# Patient Record
Sex: Female | Born: 1965 | Hispanic: No | Marital: Married | Smoking: Former smoker
Health system: Southern US, Community
[De-identification: ages and names within clinical notes are randomized; demographics above are authoritative.]

## PROBLEM LIST (undated history)

## (undated) DIAGNOSIS — Z9889 Other specified postprocedural states: Secondary | ICD-10-CM

## (undated) DIAGNOSIS — T8859XA Other complications of anesthesia, initial encounter: Secondary | ICD-10-CM

## (undated) DIAGNOSIS — T4145XA Adverse effect of unspecified anesthetic, initial encounter: Secondary | ICD-10-CM

## (undated) DIAGNOSIS — R112 Nausea with vomiting, unspecified: Secondary | ICD-10-CM

## (undated) HISTORY — PX: DILATION AND CURETTAGE OF UTERUS: SHX78

---

## 2001-11-08 ENCOUNTER — Other Ambulatory Visit: Admission: RE | Admit: 2001-11-08 | Discharge: 2001-11-08 | Payer: Self-pay | Admitting: Family Medicine

## 2005-06-20 ENCOUNTER — Ambulatory Visit: Payer: Self-pay | Admitting: Obstetrics and Gynecology

## 2006-02-19 ENCOUNTER — Observation Stay: Payer: Self-pay | Admitting: Obstetrics and Gynecology

## 2006-02-26 ENCOUNTER — Observation Stay: Payer: Self-pay | Admitting: Obstetrics and Gynecology

## 2006-03-05 ENCOUNTER — Observation Stay: Payer: Self-pay | Admitting: Obstetrics and Gynecology

## 2006-03-09 ENCOUNTER — Ambulatory Visit: Payer: Self-pay | Admitting: Obstetrics and Gynecology

## 2006-03-12 ENCOUNTER — Observation Stay: Payer: Self-pay | Admitting: Obstetrics and Gynecology

## 2006-03-19 ENCOUNTER — Inpatient Hospital Stay: Payer: Self-pay | Admitting: Obstetrics and Gynecology

## 2017-12-18 ENCOUNTER — Telehealth: Payer: Self-pay

## 2017-12-18 ENCOUNTER — Other Ambulatory Visit: Payer: Self-pay

## 2017-12-18 DIAGNOSIS — Z1211 Encounter for screening for malignant neoplasm of colon: Secondary | ICD-10-CM

## 2017-12-18 NOTE — Telephone Encounter (Signed)
Contacted patient to see if she was ok with changing her colonoscopy from May 3rd to May 10th (Dr. Bonna Gains on vacation).  Will tentatively place her on for May 10th.

## 2017-12-21 ENCOUNTER — Telehealth: Payer: Self-pay

## 2017-12-21 NOTE — Telephone Encounter (Signed)
Spoke with patient and May 10th did not work out for her so she has been moved to May 2nd.  Notified Trish in Endo this am.  Thanks Sharyn Lull

## 2018-01-04 ENCOUNTER — Telehealth: Payer: Self-pay | Admitting: Gastroenterology

## 2018-01-04 ENCOUNTER — Other Ambulatory Visit: Payer: Self-pay

## 2018-01-04 NOTE — Telephone Encounter (Signed)
Pt left vm to reschedule her Procedure 05/2

## 2018-01-28 ENCOUNTER — Ambulatory Visit
Admission: RE | Admit: 2018-01-28 | Discharge: 2018-01-28 | Disposition: A | Discharge status: Home / Self Care | Payer: Managed Care, Other (non HMO) | Source: Ambulatory Visit | Attending: Gastroenterology | Admitting: Gastroenterology

## 2018-01-28 ENCOUNTER — Encounter: Payer: Self-pay | Admitting: *Deleted

## 2018-01-28 ENCOUNTER — Encounter
Admission: RE | Disposition: A | Discharge status: Home / Self Care | Payer: Self-pay | Source: Ambulatory Visit | Attending: Gastroenterology

## 2018-01-28 ENCOUNTER — Other Ambulatory Visit: Payer: Self-pay

## 2018-01-28 ENCOUNTER — Ambulatory Visit: Payer: Managed Care, Other (non HMO) | Admitting: Registered Nurse

## 2018-01-28 DIAGNOSIS — K635 Polyp of colon: Secondary | ICD-10-CM | POA: Insufficient documentation

## 2018-01-28 DIAGNOSIS — Z87891 Personal history of nicotine dependence: Secondary | ICD-10-CM | POA: Insufficient documentation

## 2018-01-28 DIAGNOSIS — D124 Benign neoplasm of descending colon: Secondary | ICD-10-CM

## 2018-01-28 DIAGNOSIS — Z1211 Encounter for screening for malignant neoplasm of colon: Secondary | ICD-10-CM

## 2018-01-28 DIAGNOSIS — D125 Benign neoplasm of sigmoid colon: Secondary | ICD-10-CM | POA: Diagnosis not present

## 2018-01-28 DIAGNOSIS — D123 Benign neoplasm of transverse colon: Secondary | ICD-10-CM | POA: Diagnosis not present

## 2018-01-28 DIAGNOSIS — Z880 Allergy status to penicillin: Secondary | ICD-10-CM | POA: Insufficient documentation

## 2018-01-28 DIAGNOSIS — K529 Noninfective gastroenteritis and colitis, unspecified: Secondary | ICD-10-CM | POA: Diagnosis not present

## 2018-01-28 DIAGNOSIS — D128 Benign neoplasm of rectum: Secondary | ICD-10-CM | POA: Diagnosis not present

## 2018-01-28 DIAGNOSIS — Z6841 Body Mass Index (BMI) 40.0 and over, adult: Secondary | ICD-10-CM | POA: Diagnosis not present

## 2018-01-28 DIAGNOSIS — D120 Benign neoplasm of cecum: Secondary | ICD-10-CM | POA: Diagnosis not present

## 2018-01-28 DIAGNOSIS — K621 Rectal polyp: Secondary | ICD-10-CM | POA: Diagnosis not present

## 2018-01-28 HISTORY — DX: Other specified postprocedural states: Z98.890

## 2018-01-28 HISTORY — DX: Nausea with vomiting, unspecified: R11.2

## 2018-01-28 HISTORY — DX: Other complications of anesthesia, initial encounter: T88.59XA

## 2018-01-28 HISTORY — DX: Adverse effect of unspecified anesthetic, initial encounter: T41.45XA

## 2018-01-28 HISTORY — PX: COLONOSCOPY WITH PROPOFOL: SHX5780

## 2018-01-28 LAB — POCT PREGNANCY, URINE: Preg Test, Ur: NEGATIVE

## 2018-01-28 SURGERY — COLONOSCOPY WITH PROPOFOL
Anesthesia: General

## 2018-01-28 MED ORDER — SUCCINYLCHOLINE CHLORIDE 20 MG/ML IJ SOLN
INTRAMUSCULAR | Status: AC
  Filled 2018-01-28: qty 1

## 2018-01-28 MED ORDER — SODIUM CHLORIDE 0.9 % IV SOLN
INTRAVENOUS | Status: DC
  Administered 2018-01-28: 09:00:00 via INTRAVENOUS

## 2018-01-28 MED ORDER — LIDOCAINE HCL (CARDIAC) PF 100 MG/5ML IV SOSY
PREFILLED_SYRINGE | INTRAVENOUS | Status: DC | PRN
  Administered 2018-01-28: 60 mg via INTRAVENOUS

## 2018-01-28 MED ORDER — MIDAZOLAM HCL 2 MG/2ML IJ SOLN
INTRAMUSCULAR | Status: DC | PRN
  Administered 2018-01-28: 1 mg via INTRAVENOUS

## 2018-01-28 MED ORDER — PROPOFOL 10 MG/ML IV BOLUS
INTRAVENOUS | Status: DC | PRN
  Administered 2018-01-28: 70 mg via INTRAVENOUS

## 2018-01-28 MED ORDER — PROPOFOL 500 MG/50ML IV EMUL
INTRAVENOUS | Status: AC
  Filled 2018-01-28: qty 150

## 2018-01-28 MED ORDER — LIDOCAINE HCL (PF) 2 % IJ SOLN
INTRAMUSCULAR | Status: AC
  Filled 2018-01-28: qty 10

## 2018-01-28 MED ORDER — MIDAZOLAM HCL 2 MG/2ML IJ SOLN
INTRAMUSCULAR | Status: AC
  Filled 2018-01-28: qty 2

## 2018-01-28 MED ORDER — SODIUM CHLORIDE 0.9 % IJ SOLN
INTRAMUSCULAR | Status: AC
  Filled 2018-01-28: qty 10

## 2018-01-28 MED ORDER — PHENYLEPHRINE HCL 10 MG/ML IJ SOLN
INTRAMUSCULAR | Status: AC
  Filled 2018-01-28: qty 1

## 2018-01-28 MED ORDER — PROPOFOL 500 MG/50ML IV EMUL
INTRAVENOUS | Status: DC | PRN
  Administered 2018-01-28: 120 ug/kg/min via INTRAVENOUS

## 2018-01-28 MED ORDER — EPHEDRINE SULFATE 50 MG/ML IJ SOLN
INTRAMUSCULAR | Status: AC
  Filled 2018-01-28: qty 1

## 2018-01-28 NOTE — Transfer of Care (Signed)
Immediate Anesthesia Transfer of Care Note  Patient: Katelyn Zamora  Procedure(s) Performed: COLONOSCOPY WITH PROPOFOL (N/A )  Patient Location: PACU  Anesthesia Type:General  Level of Consciousness: sedated  Airway & Oxygen Therapy: Patient Spontanous Breathing and Patient connected to nasal cannula oxygen  Post-op Assessment: Report given to RN and Post -op Vital signs reviewed and stable  Post vital signs: Reviewed and stable  Last Vitals:  Vitals Value Taken Time  BP 117/57 01/28/2018 10:12 AM  Temp 36.1 C 01/28/2018 10:12 AM  Pulse 80 01/28/2018 10:12 AM  Resp 17 01/28/2018 10:12 AM  SpO2 99 % 01/28/2018 10:12 AM  Vitals shown include unvalidated device data.  Last Pain:  Vitals:   01/28/18 1011  TempSrc: Tympanic  PainSc: Asleep         Complications: No apparent anesthesia complications

## 2018-01-28 NOTE — Anesthesia Post-op Follow-up Note (Signed)
Anesthesia QCDR form completed.        

## 2018-01-28 NOTE — H&P (Signed)
Cephas Darby, MD [address]  [place]  [place], [place] [address]  Main: [phone]  Fax: [phone] Pager: [phone]  Primary Care Physician:  Elgie Collard, MD Primary Gastroenterologist:  Dr. Cephas Darby  Pre-Procedure History & Physical: HPI:  Katelyn Zamora is a 52 y.o. female is here for an colonoscopy.   Past Medical History:  Diagnosis Date  . Complication of anesthesia   . PONV (postoperative nausea and vomiting)     Past Surgical History:  Procedure Laterality Date  . CESAREAN SECTION    . DILATION AND CURETTAGE OF UTERUS      Prior to Admission medications   Not on File    Allergies as of 12/18/2017  . (Not on File)    History reviewed. No pertinent family history.  Social History   Socioeconomic History  . Marital status: Married    Spouse name: Not on file  . Number of children: Not on file  . Years of education: Not on file  . Highest education level: Not on file  Occupational History  . Not on file  Social Needs  . Financial resource strain: Not on file  . Food insecurity:    Worry: Not on file    Inability: Not on file  . Transportation needs:    Medical: Not on file    Non-medical: Not on file  Tobacco Use  . Smoking status: Former Research scientist (life sciences)  . Smokeless tobacco: Never Used  Substance and Sexual Activity  . Alcohol use: Never    Frequency: Never  . Drug use: Never  . Sexual activity: Not on file  Lifestyle  . Physical activity:    Days per week: Not on file    Minutes per session: Not on file  . Stress: Not on file  Relationships  . Social connections:    Talks on phone: Not on file    Gets together: Not on file    Attends religious service: Not on file    Active member of club or organization: Not on file    Attends meetings of clubs or organizations: Not on file    Relationship status: Not on file  . Intimate partner violence:    Fear of current or ex partner: Not on file    Emotionally abused: Not  on file    Physically abused: Not on file    Forced sexual activity: Not on file  Other Topics Concern  . Not on file  Social History Narrative  . Not on file    Review of Systems: See HPI, otherwise negative ROS  Physical Exam: BP (!) 147/82   Pulse 77   Temp (!) 96.9 F (36.1 C) (Tympanic)   Resp 20   Ht 5\' 5"  (1.651 m)   Wt 280 lb (127 kg)   LMP 03/08/2017   SpO2 98%   BMI 46.59 kg/m  General:   Alert,  pleasant and cooperative in NAD Head:  Normocephalic and atraumatic. Neck:  Supple; no masses or thyromegaly. Lungs:  Clear throughout to auscultation.    Heart:  Regular rate and rhythm. Abdomen:  Soft, nontender and nondistended. Normal bowel sounds, without guarding, and without rebound.   Neurologic:  Alert and  oriented x4;  grossly normal neurologically.  Impression/Plan: Katelyn Zamora is here for an colonoscopy to be performed for colon cancer screening  Risks, benefits, limitations, and alternatives regarding  colonoscopy have been reviewed with the patient.  Questions have been answered.  All parties agreeable.  Sherri Sear, MD  01/28/2018, 9:26 AM

## 2018-01-28 NOTE — Anesthesia Preprocedure Evaluation (Signed)
Anesthesia Evaluation  Patient identified by MRN, date of birth, ID band Patient awake    Reviewed: Allergy & Precautions, H&P , NPO status , reviewed documented beta blocker date and time   History of Anesthesia Complications (+) PONV and history of anesthetic complications  Airway Mallampati: II  TM Distance: >3 FB Neck ROM: full    Dental  (+) Teeth Intact   Pulmonary neg sleep apnea, former smoker,    Pulmonary exam normal        Cardiovascular Normal cardiovascular exam     Neuro/Psych    GI/Hepatic neg GERD  ,  Endo/Other  Morbid obesity  Renal/GU      Musculoskeletal   Abdominal   Peds  Hematology   Anesthesia Other Findings Past Medical History: No date: Complication of anesthesia No date: PONV (postoperative nausea and vomiting)  Past Surgical History: No date: CESAREAN SECTION No date: DILATION AND CURETTAGE OF UTERUS  BMI    Body Mass Index:  46.59 kg/m      Reproductive/Obstetrics                             Anesthesia Physical Anesthesia Plan  ASA: III  Anesthesia Plan: General   Post-op Pain Management:    Induction:   PONV Risk Score and Plan: 3 and Treatment may vary due to age or medical condition and TIVA  Airway Management Planned:   Additional Equipment:   Intra-op Plan:   Post-operative Plan:   Informed Consent: I have reviewed the patients History and Physical, chart, labs and discussed the procedure including the risks, benefits and alternatives for the proposed anesthesia with the patient or authorized representative who has indicated his/her understanding and acceptance.   Dental Advisory Given  Plan Discussed with: CRNA  Anesthesia Plan Comments:         Anesthesia Quick Evaluation

## 2018-01-28 NOTE — Op Note (Addendum)
[facility] Gastroenterology Patient Name: Katelyn Zamora Procedure Date: 01/28/2018 7:48 AM MRN: [id number] Account #: [id number] Date of Birth: 1965-12-21 Admit Type: Outpatient Age: 52 Room: [facility] ENDO ROOM 1 Gender: Female Note Status: Finalized THIS EXAM WAS SENT IN ERROR

## 2018-01-28 NOTE — Op Note (Addendum)
[facility] Gastroenterology Patient Name: Katelyn Zamora Procedure Date: 01/28/2018 9:27 AM MRN: [id number] Account #: [id number] Date of Birth: 1965/12/16 Admit Type: Outpatient Age: 52 Room: [facility] ENDO ROOM 1 Gender: Female Note Status: Finalized Procedure:            Colonoscopy Indications:          Screening for colorectal malignant neoplasm, This is                        the patient's first colonoscopy Providers:            Lin Landsman MD, MD Medicines:            Monitored Anesthesia Care Complications:        No immediate complications. Estimated blood loss: None. Procedure:            Pre-Anesthesia Assessment:                       - Prior to the procedure, a History and Physical was                        performed, and patient medications and allergies were                        reviewed. The patient is competent. The risks and                        benefits of the procedure and the sedation options and                        risks were discussed with the patient. All questions                        were answered and informed consent was obtained.                        Patient identification and proposed procedure were                        verified by the physician, the nurse, the                        anesthesiologist, the anesthetist and the technician in                        the pre-procedure area in the procedure room in the                        endoscopy suite. Mental Status Examination: alert and                        oriented. Airway Examination: normal oropharyngeal                        airway and neck mobility. Respiratory Examination:                        clear to auscultation. CV Examination: normal.  Prophylactic Antibiotics: The patient does not require                        prophylactic antibiotics. Prior Anticoagulants: The                        patient has taken no previous anticoagulant or                         antiplatelet agents. ASA Grade Assessment: III - A                        patient with severe systemic disease. After reviewing                        the risks and benefits, the patient was deemed in                        satisfactory condition to undergo the procedure. The                        anesthesia plan was to use monitored anesthesia care                        (MAC). Immediately prior to administration of                        medications, the patient was re-assessed for adequacy                        to receive sedatives. The heart rate, respiratory rate,                        oxygen saturations, blood pressure, adequacy of                        pulmonary ventilation, and response to care were                        monitored throughout the procedure. The physical status                        of the patient was re-assessed after the procedure.                       After obtaining informed consent, the colonoscope was                        passed under direct vision. Throughout the procedure,                        the patient's blood pressure, pulse, and oxygen                        saturations were monitored continuously. The                        Colonoscope was introduced through the anus and  advanced to the the terminal ileum. The colonoscopy was                        technically difficult and complex due to significant                        looping and the patient's body habitus. Successful                        completion of the procedure was aided by applying                        abdominal pressure. The patient tolerated the procedure                        well. The quality of the bowel preparation was                        evaluated using the BBPS [facility] Bowel Preparation                        Scale) with scores of: Right Colon = 3, Transverse                        Colon = 3 and Left Colon = 3 (entire  mucosa seen well                        with no residual staining, small fragments of stool or                        opaque liquid). The total BBPS score equals 9. Findings:      The perianal and digital rectal examinations were normal. Pertinent       negatives include normal sphincter tone and no palpable rectal lesions.      The terminal ileum appeared normal.      A diminutive polyp was found in the cecum. The polyp was sessile. The       polyp was removed with a cold biopsy forceps. Resection and retrieval       were complete.      A 5 mm polypoid lesion was found at the ileocecal valve. The lesion was       sessile. No bleeding was present. This was biopsied with a cold forceps       for histology.      Three sessile polyps were found in the rectum, descending colon and       transverse colon. The polyps were 5 to 8 mm in size. These polyps were       removed with a hot snare. Resection and retrieval were complete.      A 10 mm polyp was found in the sigmoid colon. The polyp was       pedunculated. The polyp was removed with a hot snare. Resection and       retrieval were complete.      A 5 mm polyp was found in the sigmoid colon. The polyp was sessile. The       polyp was removed with a cold snare. Resection and retrieval were       complete.      The retroflexed view of  the distal rectum and anal verge was normal and       showed no anal or rectal abnormalities. Impression:           - The examined portion of the ileum was normal.                       - One diminutive polyp in the cecum, removed with a                        cold biopsy forceps. Resected and retrieved.                       - Benign polypoid lesion at the ileocecal valve.                        Biopsied.                       - Three 5 to 8 mm polyps in the rectum, in the                        descending colon and in the transverse colon, removed                        with a hot snare. Resected and  retrieved.                       - One 10 mm polyp in the sigmoid colon, removed with a                        hot snare. Resected and retrieved.                       - One 5 mm polyp in the sigmoid colon, removed with a                        cold snare. Resected and retrieved.                       - The distal rectum and anal verge are normal on                        retroflexion view. Recommendation:       - Discharge patient to home.                       - Resume previous diet today.                       - Continue present medications.                       - Await pathology results.                       - Repeat colonoscopy in 3 years for surveillance of                        multiple polyps. Procedure Code(s):    --- Professional ---  45385, Colonoscopy, flexible; with removal of tumor(s),                        polyp(s), or other lesion(s) by snare technique                       45380, 59, Colonoscopy, flexible; with biopsy, single                        or multiple Diagnosis Code(s):    --- Professional ---                       Z12.11, Encounter for screening for malignant neoplasm                        of colon                       D12.0, Benign neoplasm of cecum                       D12.5, Benign neoplasm of sigmoid colon                       K62.1, Rectal polyp                       D12.4, Benign neoplasm of descending colon                       D12.3, Benign neoplasm of transverse colon (hepatic                        flexure or splenic flexure) CPT copyright 2017 American Medical Association. All rights reserved. The codes documented in this report are preliminary and upon coder review may  be revised to meet current compliance requirements. Dr. Ulyess Mort Lin Landsman MD, MD 01/28/2018 10:13:01 AM This report has been signed electronically. Number of Addenda: 0 Note Initiated On: 01/28/2018 9:27 AM Scope Withdrawal Time: 0  hours 29 minutes 25 seconds  Total Procedure Duration: 0 hours 34 minutes 8 seconds       [facility]

## 2018-01-28 NOTE — Anesthesia Postprocedure Evaluation (Signed)
Anesthesia Post Note  Patient: Susy Manor  Procedure(s) Performed: COLONOSCOPY WITH PROPOFOL (N/A )  Patient location during evaluation: Endoscopy Anesthesia Type: General Level of consciousness: awake and alert Pain management: pain level controlled Vital Signs Assessment: post-procedure vital signs reviewed and stable Respiratory status: spontaneous breathing, nonlabored ventilation and respiratory function stable Cardiovascular status: blood pressure returned to baseline and stable Postop Assessment: no apparent nausea or vomiting Anesthetic complications: no     Last Vitals:  Vitals:   01/28/18 1031 01/28/18 1041  BP: 121/74 117/74  Pulse:    Resp:    Temp:    SpO2:      Last Pain:  Vitals:   01/28/18 1041  TempSrc:   PainSc: 0-No pain                 Alphonsus Sias

## 2018-01-29 ENCOUNTER — Encounter: Payer: Self-pay | Admitting: Gastroenterology

## 2018-02-03 ENCOUNTER — Encounter: Payer: Self-pay | Admitting: Gastroenterology

## 2018-02-10 ENCOUNTER — Telehealth: Payer: Self-pay | Admitting: Gastroenterology

## 2018-02-10 NOTE — Telephone Encounter (Signed)
Pt left vm for colonoscopy results °

## 2018-02-16 NOTE — Telephone Encounter (Signed)
Patient has been notified of colonoscopy results and verbalized understanding

## 2018-02-23 LAB — SURGICAL PATHOLOGY

## 2018-06-11 ENCOUNTER — Encounter: Payer: Managed Care, Other (non HMO) | Admitting: Family Medicine

## 2019-01-27 ENCOUNTER — Encounter (HOSPITAL_COMMUNITY): Payer: Self-pay

## 2019-01-27 ENCOUNTER — Emergency Department (HOSPITAL_COMMUNITY): Payer: Self-pay

## 2019-01-27 ENCOUNTER — Other Ambulatory Visit: Payer: Self-pay

## 2019-01-27 ENCOUNTER — Emergency Department (HOSPITAL_COMMUNITY)
Admission: EM | Admit: 2019-01-27 | Discharge: 2019-01-27 | Disposition: A | Discharge status: Home / Self Care | Payer: Self-pay | Attending: Emergency Medicine | Admitting: Emergency Medicine

## 2019-01-27 DIAGNOSIS — R109 Unspecified abdominal pain: Secondary | ICD-10-CM

## 2019-01-27 DIAGNOSIS — Z87891 Personal history of nicotine dependence: Secondary | ICD-10-CM | POA: Insufficient documentation

## 2019-01-27 DIAGNOSIS — R1011 Right upper quadrant pain: Secondary | ICD-10-CM | POA: Insufficient documentation

## 2019-01-27 LAB — CBC WITH DIFFERENTIAL/PLATELET
Abs Immature Granulocytes: 0.04 10*3/uL (ref 0.00–0.07)
Basophils Absolute: 0 10*3/uL (ref 0.0–0.1)
Basophils Relative: 0 %
Eosinophils Absolute: 0 10*3/uL (ref 0.0–0.5)
Eosinophils Relative: 0 %
HCT: 44.8 % (ref 36.0–46.0)
Hemoglobin: 14.2 g/dL (ref 12.0–15.0)
Immature Granulocytes: 0 %
Lymphocytes Relative: 17 %
Lymphs Abs: 1.7 10*3/uL (ref 0.7–4.0)
MCH: 29.3 pg (ref 26.0–34.0)
MCHC: 31.7 g/dL (ref 30.0–36.0)
MCV: 92.6 fL (ref 80.0–100.0)
Monocytes Absolute: 0.6 10*3/uL (ref 0.1–1.0)
Monocytes Relative: 6 %
Neutro Abs: 7.6 10*3/uL (ref 1.7–7.7)
Neutrophils Relative %: 77 %
Platelets: 254 10*3/uL (ref 150–400)
RBC: 4.84 MIL/uL (ref 3.87–5.11)
RDW: 14.3 % (ref 11.5–15.5)
WBC: 10 10*3/uL (ref 4.0–10.5)
nRBC: 0 % (ref 0.0–0.2)

## 2019-01-27 LAB — I-STAT BETA HCG BLOOD, ED (MC, WL, AP ONLY): I-stat hCG, quantitative: <5 m[IU]/mL (ref <5)

## 2019-01-27 LAB — URINALYSIS, ROUTINE W REFLEX MICROSCOPIC
Bilirubin Urine: NEGATIVE
Glucose, UA: NEGATIVE mg/dL
Ketones, ur: NEGATIVE mg/dL
Leukocytes,Ua: NEGATIVE
Nitrite: NEGATIVE
Protein, ur: 100 mg/dL — AB
Specific Gravity, Urine: 1.028 (ref 1.005–1.030)
pH: 5 (ref 5.0–8.0)

## 2019-01-27 LAB — COMPREHENSIVE METABOLIC PANEL
ALT: 20 U/L (ref 0–44)
AST: 18 U/L (ref 15–41)
Albumin: 3.7 g/dL (ref 3.5–5.0)
Alkaline Phosphatase: 44 U/L (ref 38–126)
Anion gap: 12 (ref 5–15)
BUN: 12 mg/dL (ref 6–20)
CO2: 25 mmol/L (ref 22–32)
Calcium: 9.2 mg/dL (ref 8.9–10.3)
Chloride: 99 mmol/L (ref 98–111)
Creatinine, Ser: 0.8 mg/dL (ref 0.44–1.00)
GFR calc Af Amer: >60 mL/min (ref >60)
GFR calc non Af Amer: >60 mL/min (ref >60)
Glucose, Bld: 156 mg/dL — ABNORMAL HIGH (ref 70–99)
Potassium: 4.1 mmol/L (ref 3.5–5.1)
Sodium: 136 mmol/L (ref 135–145)
Total Bilirubin: 0.6 mg/dL (ref 0.3–1.2)
Total Protein: 7.1 g/dL (ref 6.5–8.1)

## 2019-01-27 LAB — LIPASE, BLOOD: Lipase: 21 U/L (ref 11–51)

## 2019-01-27 MED ORDER — ONDANSETRON HCL 4 MG/2ML IJ SOLN
4.0000 mg | Freq: Once | INTRAMUSCULAR | Status: AC
  Administered 2019-01-27: 4 mg via INTRAVENOUS
  Filled 2019-01-27: qty 2

## 2019-01-27 MED ORDER — MORPHINE SULFATE (PF) 4 MG/ML IV SOLN
4.0000 mg | Freq: Once | INTRAVENOUS | Status: AC
  Administered 2019-01-27: 4 mg via INTRAVENOUS
  Filled 2019-01-27: qty 1

## 2019-01-27 MED ORDER — IBUPROFEN 600 MG PO TABS: 600.0000 mg | ORAL_TABLET | Freq: Four times a day (QID) | ORAL | 0 refills | Status: AC | PRN

## 2019-01-27 MED ORDER — HYDROMORPHONE HCL 1 MG/ML IJ SOLN
1.0000 mg | Freq: Once | INTRAMUSCULAR | Status: AC
  Administered 2019-01-27: 1 mg via INTRAVENOUS
  Filled 2019-01-27: qty 1

## 2019-01-27 MED ORDER — HYDROMORPHONE HCL 1 MG/ML IJ SOLN
1.0000 mg | Freq: Once | INTRAMUSCULAR | Status: AC
  Administered 2019-01-27: 06:00:00 1 mg via INTRAVENOUS
  Filled 2019-01-27: qty 1

## 2019-01-27 MED ORDER — ONDANSETRON HCL 4 MG/2ML IJ SOLN
4.0000 mg | Freq: Once | INTRAMUSCULAR | Status: AC
Start: 2019-01-27 — End: 2019-01-27
  Administered 2019-01-27: 4 mg via INTRAVENOUS
  Filled 2019-01-27: qty 2

## 2019-01-27 NOTE — ED Triage Notes (Signed)
Pt c/o right flank pain x 2 hours accompanied by nausea.

## 2019-01-27 NOTE — ED Provider Notes (Signed)
[facility] EMERGENCY DEPARTMENT Provider Note   CSN: [id number] Arrival date & time: 01/27/19  9833    History   Chief Complaint Chief Complaint  Patient presents with  . Flank Pain    HPI Katelyn Zamora is a 53 y.o. female.     Patient presents to the emergency department with a chief complaint of right upper quadrant abdominal pain that radiates to her back and flank.  She states that she is having intermittent symptoms throughout the day yesterday, but the symptoms significantly worsened last night.  She states that she has been trying to avoid coming to the emergency department, but the pain became too great.  She has had associated nausea with vomiting.  Denies any fevers or chills.  Denies any prior abdominal surgeries.  Denies any history of kidney stones.  Denies any association with eating or drinking.  She denies any dysuria or hematuria.  Denies any vaginal complaints.  Rates her pain is severe.  The history is provided by the patient. No language interpreter was used.    Past Medical History:  Diagnosis Date  . Complication of anesthesia   . PONV (postoperative nausea and vomiting)     Patient Active Problem List   Diagnosis Date Noted  . Encounter for screening colonoscopy     Past Surgical History:  Procedure Laterality Date  . CESAREAN SECTION    . COLONOSCOPY WITH PROPOFOL N/A 01/28/2018   Procedure: COLONOSCOPY WITH PROPOFOL;  Surgeon: Lin Landsman, MD;  Location: [facility] ENDOSCOPY;  Service: Endoscopy;  Laterality: N/A;  . DILATION AND CURETTAGE OF UTERUS       OB History   No obstetric history on file.      Home Medications    Prior to Admission medications   Not on File    Family History History reviewed. No pertinent family history.  Social History Social History   Tobacco Use  . Smoking status: Former Research scientist (life sciences)  . Smokeless tobacco: Never Used  Substance Use Topics  . Alcohol use: Never    Frequency: Never  .  Drug use: Never     Allergies   Penicillins   Review of Systems Review of Systems  All other systems reviewed and are negative.    Physical Exam Updated Vital Signs BP (!) 166/80   Pulse 83   Temp 97.7 F (36.5 C) (Oral)   Resp 20   Ht 5\' 5"  (1.651 m)   Wt 136.1 kg   SpO2 100%   BMI 49.92 kg/m   Physical Exam Vitals signs and nursing note reviewed.  Constitutional:      General: She is not in acute distress.    Appearance: She is well-developed.  HENT:     Head: Normocephalic and atraumatic.  Eyes:     Conjunctiva/sclera: Conjunctivae normal.  Neck:     Musculoskeletal: Neck supple.  Cardiovascular:     Rate and Rhythm: Normal rate and regular rhythm.     Heart sounds: No murmur.  Pulmonary:     Effort: Pulmonary effort is normal. No respiratory distress.     Breath sounds: Normal breath sounds.  Abdominal:     Palpations: Abdomen is soft.     Tenderness: There is abdominal tenderness.     Comments: Right upper abdominal tenderness, positive Murphy sign, no other abdominal tenderness  Skin:    General: Skin is warm and dry.  Neurological:     Mental Status: She is alert and oriented to person, place,  and time.  Psychiatric:        Mood and Affect: Mood normal.        Behavior: Behavior normal.        Thought Content: Thought content normal.        Judgment: Judgment normal.      ED Treatments / Results  Labs (all labs ordered are listed, but only abnormal results are displayed) Labs Reviewed  CBC WITH DIFFERENTIAL/PLATELET  COMPREHENSIVE METABOLIC PANEL  LIPASE, BLOOD  URINALYSIS, ROUTINE W REFLEX MICROSCOPIC  I-STAT BETA HCG BLOOD, ED ([facility], [facility], [facility] ONLY)    EKG None  Radiology No results found.  Procedures Procedures (including critical care time)  Medications Ordered in ED Medications  morphine 4 MG/ML injection 4 mg (has no administration in time range)  ondansetron (ZOFRAN) injection 4 mg (has no administration in time range)      Initial Impression / Assessment and Plan / ED Course  I have reviewed the triage vital signs and the nursing notes.  Pertinent labs & imaging results that were available during my care of the patient were reviewed by me and considered in my medical decision making (see chart for details).        Patient with right upper abdominal pain that radiates to the back and right flank.  Intermittent symptoms yesterday, but worsening of symptoms last night.  Will treat pain and nausea.  Will check labs and right upper quadrant ultrasound.  RUQ shows no dilated CBD or stones.  GB wall is normal.  Will proceed with CT to r/o KS.  Patient signed out to oncoming team, who will continue care.  Pain well controlled.  Final Clinical Impressions(s) / ED Diagnoses   Final diagnoses:  None    ED Discharge Orders    None       Montine Circle, PA-C 52/77/82 [id number]    Delora Fuel, MD 36/14/43 2256

## 2019-01-27 NOTE — Discharge Instructions (Signed)
Please call and follow-up closely with your primary care provider for further evaluation of your condition.  CT scan today as well as your ultrasound did not show any acute concerning changes.  Your blood work is normal.  Return to the ER if your condition worsen or if you have any other concern.

## 2019-01-27 NOTE — ED Provider Notes (Signed)
Received signout at the beginning of shift.  Patient here with pain to her right upper quadrant radiates to her right flank.  Work-up includes basic labs, right upper quadrant ultrasound as well as CT scan.  Labs are reassuring, ultrasound CT scan without any acute finding.  Urine shows moderate hemoglobin but no evidence of infection.  Query potential recently passed kidney stone which may contribute to her pain.  Her pregnancy test is negative.  At this time, I encourage patient to follow-up with her primary care provider for further evaluation of her condition.  Return precaution discussed.  BP 134/71   Pulse 76   Temp 97.7 F (36.5 C) (Oral)   Resp 16   Ht 5\' 5"  (1.651 m)   Wt 136.1 kg   SpO2 92%   BMI 49.92 kg/m   Results for orders placed or performed during the hospital encounter of 01/27/19  CBC with Differential/Platelet  Result Value Ref Range   WBC 10.0 4.0 - 10.5 K/uL   RBC 4.84 3.87 - 5.11 MIL/uL   Hemoglobin 14.2 12.0 - 15.0 g/dL   HCT 44.8 36.0 - 46.0 %   MCV 92.6 80.0 - 100.0 fL   MCH 29.3 26.0 - 34.0 pg   MCHC 31.7 30.0 - 36.0 g/dL   RDW 14.3 11.5 - 15.5 %   Platelets 254 150 - 400 K/uL   nRBC 0.0 0.0 - 0.2 %   Neutrophils Relative % 77 %   Neutro Abs 7.6 1.7 - 7.7 K/uL   Lymphocytes Relative 17 %   Lymphs Abs 1.7 0.7 - 4.0 K/uL   Monocytes Relative 6 %   Monocytes Absolute 0.6 0.1 - 1.0 K/uL   Eosinophils Relative 0 %   Eosinophils Absolute 0.0 0.0 - 0.5 K/uL   Basophils Relative 0 %   Basophils Absolute 0.0 0.0 - 0.1 K/uL   Immature Granulocytes 0 %   Abs Immature Granulocytes 0.04 0.00 - 0.07 K/uL  Comprehensive metabolic panel  Result Value Ref Range   Sodium 136 135 - 145 mmol/L   Potassium 4.1 3.5 - 5.1 mmol/L   Chloride 99 98 - 111 mmol/L   CO2 25 22 - 32 mmol/L   Glucose, Bld 156 (H) 70 - 99 mg/dL   BUN 12 6 - 20 mg/dL   Creatinine, Ser 0.80 0.44 - 1.00 mg/dL   Calcium 9.2 8.9 - 10.3 mg/dL   Total Protein 7.1 6.5 - 8.1 g/dL   Albumin 3.7 3.5 -  5.0 g/dL   AST 18 15 - 41 U/L   ALT 20 0 - 44 U/L   Alkaline Phosphatase 44 38 - 126 U/L   Total Bilirubin 0.6 0.3 - 1.2 mg/dL   GFR calc non Af Amer >60 >60 mL/min   GFR calc Af Amer >60 >60 mL/min   Anion gap 12 5 - 15  Lipase, blood  Result Value Ref Range   Lipase 21 11 - 51 U/L  Urinalysis, Routine w reflex microscopic  Result Value Ref Range   Color, Urine YELLOW YELLOW   APPearance HAZY (A) CLEAR   Specific Gravity, Urine 1.028 1.005 - 1.030   pH 5.0 5.0 - 8.0   Glucose, UA NEGATIVE NEGATIVE mg/dL   Hgb urine dipstick MODERATE (A) NEGATIVE   Bilirubin Urine NEGATIVE NEGATIVE   Ketones, ur NEGATIVE NEGATIVE mg/dL   Protein, ur 100 (A) NEGATIVE mg/dL   Nitrite NEGATIVE NEGATIVE   Leukocytes,Ua NEGATIVE NEGATIVE   RBC / HPF 0-5 0 - 5  RBC/hpf   WBC, UA 0-5 0 - 5 WBC/hpf   Bacteria, UA RARE (A) NONE SEEN   Squamous Epithelial / LPF 0-5 0 - 5   Mucus PRESENT   I-Stat Beta hCG blood, ED ([facility], [facility], [facility] only)  Result Value Ref Range   I-stat hCG, quantitative <5.0 <5 mIU/mL   Comment 3           US Abdomen Limited  Result Date: 01/27/2019 CLINICAL DATA:  Right upper quadrant pain EXAM: ULTRASOUND ABDOMEN LIMITED RIGHT UPPER QUADRANT COMPARISON:  None. FINDINGS: Gallbladder: No gallstones or wall thickening visualized. No sonographic Murphy sign noted by sonographer. Common bile duct: Diameter: 5 mm Liver: No focal lesion identified. Echogenic liver when compared to the kidney. Portal vein is patent on color Doppler imaging with normal direction of blood flow towards the liver. IMPRESSION: No acute finding or cholelithiasis. Hepatic steatosis. Electronically Signed   By: Monte Fantasia M.D.   On: 01/27/2019 06:33   Ct Renal Stone Study  Result Date: 01/27/2019 CLINICAL DATA:  Right-sided flank pain for 1 day EXAM: CT ABDOMEN AND PELVIS WITHOUT CONTRAST TECHNIQUE: Multidetector CT imaging of the abdomen and pelvis was performed following the standard protocol without IV contrast.  COMPARISON:  Previous ultrasound FINDINGS: Lower chest: No acute abnormality. Hepatobiliary: No focal liver abnormality is seen. No gallstones, gallbladder wall thickening, or biliary dilatation. Pancreas: Unremarkable. No pancreatic ductal dilatation or surrounding inflammatory changes. Spleen: Normal in size without focal abnormality. Adrenals/Urinary Tract: Adrenal glands are within normal limits bilaterally. Kidneys are well visualized bilaterally. No definitive renal calculi are seen. No obstructive changes are seen. The bladder is decompressed. Stomach/Bowel: Mild diverticular change of the colon is noted without evidence of diverticulitis. The appendix is within normal limits. No small bowel abnormality is seen. The stomach is within normal limits. Vascular/Lymphatic: Aortic atherosclerosis. No enlarged abdominal or pelvic lymph nodes. Reproductive: Uterus and bilateral adnexa are unremarkable. Other: No abdominal wall hernia or abnormality. No abdominopelvic ascites. Musculoskeletal: No acute or significant osseous findings. IMPRESSION: Diverticulosis without diverticulitis. No renal calculi or obstructive changes are noted. Electronically Signed   By: Inez Catalina M.D.   On: 01/27/2019 07:42      Domenic Moras, PA-C 01/27/19 [id number]    Valarie Merino, MD 01/28/19 [phone]

## 2019-04-09 ENCOUNTER — Other Ambulatory Visit: Payer: Self-pay

## 2019-04-09 DIAGNOSIS — Z20822 Contact with and (suspected) exposure to covid-19: Secondary | ICD-10-CM

## 2019-04-10 LAB — NOVEL CORONAVIRUS, NAA: SARS-CoV-2, NAA: NOT DETECTED

## 2019-12-20 ENCOUNTER — Ambulatory Visit: Payer: Medicaid Other | Attending: Physician Assistant

## 2019-12-20 ENCOUNTER — Other Ambulatory Visit: Payer: Self-pay

## 2019-12-20 DIAGNOSIS — G8929 Other chronic pain: Secondary | ICD-10-CM

## 2019-12-20 DIAGNOSIS — M436 Torticollis: Secondary | ICD-10-CM | POA: Insufficient documentation

## 2019-12-20 DIAGNOSIS — M25512 Pain in left shoulder: Secondary | ICD-10-CM | POA: Insufficient documentation

## 2019-12-20 DIAGNOSIS — M5412 Radiculopathy, cervical region: Secondary | ICD-10-CM

## 2019-12-20 DIAGNOSIS — R293 Abnormal posture: Secondary | ICD-10-CM | POA: Insufficient documentation

## 2019-12-20 NOTE — Patient Instructions (Signed)
Stretching levator , scapula and cervical  2-3 reps3-4x/day  5-20 sec hold

## 2019-12-20 NOTE — Therapy (Signed)
[place], [place], [address] Phone: [phone]   Fax:  [phone]  Physical Therapy Evaluation  Patient Details  Name: Katelyn Zamora MRN: [id number] Date of Birth: 1966-07-23 Referring Provider (PT): Tereasa Coop, [place]   Encounter Date: 12/20/2019  PT End of Session - 12/20/19 1057    Visit Number  1    Number of Visits  12    Date for PT Re-Evaluation  02/10/20    Authorization Type  MCD    PT Start Time  [id number]    PT Stop Time  1130    PT Time Calculation (min)  43 min    Activity Tolerance  Patient tolerated treatment well    Behavior During Therapy  [facility] for tasks assessed/performed       Past Medical History:  Diagnosis Date  . Complication of anesthesia   . PONV (postoperative nausea and vomiting)     Past Surgical History:  Procedure Laterality Date  . CESAREAN SECTION    . COLONOSCOPY WITH PROPOFOL N/A 01/28/2018   Procedure: COLONOSCOPY WITH PROPOFOL;  Surgeon: Katelyn Landsman, MD;  Location: [facility] ENDOSCOPY;  Service: Endoscopy;  Laterality: N/A;  . DILATION AND CURETTAGE OF UTERUS      There were no vitals filed for this visit.   Subjective Assessment - 12/20/19 1051    Subjective  She reports neck and LT arm pain. 2 -3 months duration. Pain without injury.    Limitations  Lifting;House hold activities   some times with driving, can't sleep on side   Diagnostic tests  xray :: DDD and spurring.    Patient Stated Goals  Get some relief    Currently in Pain?  Yes    Pain Score  6     Pain Location  Neck    Pain Orientation  Left    Pain Descriptors / Indicators  Spasm    Pain Type  Chronic pain    Pain Radiating Towards  Lt shoulder to elbow.    Pain Onset  More than a month ago    Pain Frequency  Constant    Aggravating Factors   lifting, using LT arm    Pain Relieving Factors  tylenol         [facility] PT Assessment - 12/20/19 0001      Assessment   Medical Diagnosis   Cervical radiculopathy    Referring Provider (PT)  Tereasa Coop, PA-C    Onset Date/Surgical Date  --   2-3 months ago   Hand Dominance  Right    Next MD Visit  3 weeks    Prior Therapy  No      Precautions   Precautions  None      Restrictions   Weight Bearing Restrictions  No      Balance Screen   Has the patient fallen in the past 6 months  Yes    How many times?  1   foot caught on water and slipped   Has the patient had a decrease in activity level because of a fear of falling?   No    Is the patient reluctant to leave their home because of a fear of falling?   No      Prior Function   Level of Independence  Independent      Cognition   Overall Cognitive Status  Within Functional Limits for tasks assessed      ROM / Strength   AROM /  PROM / Strength  AROM;Strength      AROM   AROM Assessment Site  Cervical    Cervical Flexion  48   incr pain   Cervical Extension  38    Cervical - Right Side Bend  22    Cervical - Left Side Bend  22   more discomfort than RT   Cervical - Right Rotation  55    Cervical - Left Rotation  55   more discomfort.      Strength   Overall Strength Comments  UE WNL Pain with abduction and ER       Special Tests    Special Tests  Rotator Cuff Impingement    Rotator Cuff Impingment tests  Neer impingement test;Empty Can test;Full Can test      Neer Impingement test    Findings  Positive    Side  Left      Empty Can test   Findings  Positive    Side  Left      Full Can test   Findings  Positive    Side  Left                Objective measurements completed on examination: See above findings.              PT Education - 12/20/19 1148    Education Details  POC , HEP,    Person(s) Educated  Patient    Methods  Explanation;Demonstration;Tactile cues;Verbal cues;Handout    Comprehension  Verbalized understanding;Returned demonstration       PT Short Term Goals - 12/20/19 1136      PT SHORT TERM GOAL #1    Title  She will be independent with initial HEP    Baseline  no program    Time  2    Period  Weeks    Status  New      PT SHORT TERM GOAL #2   Title  She will report pain decreased 25% in neck    Baseline  6-7/10 at rest at eval    Time  2    Period  Weeks    Status  New        PT Long Term Goals - 12/20/19 1136      PT LONG TERM GOAL #1   Title  She will be indepndendent with all HEP    Baseline  Independent with all HEP issued    Time  6    Period  Weeks    Status  New      PT LONG TERM GOAL #2   Title  She will report pain intermittant  and decreased 75% in neck and LT shoulder    Baseline  6-7/10 pain at rest at eval    Time  6    Period  Weeks    Status  New      PT LONG TERM GOAL #3   Title  She will report pain decreased to 1-2/10 with home tasks uing LT arm    Baseline  Pain in neck and shoulder with home tasks using LT arm    Time  6    Period  Weeks    Status  New             Plan - 12/20/19 1102    Clinical Impression Statement  [name] presents with  chronic neck and shoulder pain that is mostly constant on Lt side. She is independent but has  pain with all activity using LT arm. She demo forward head  and shoulder posture. She is trnder on the LT neck snd [place]. She has some + impingemnet signs.  She should improve with skilled PT and  consistent HEP    Personal Factors and Comorbidities  Fitness;Time since onset of injury/illness/exacerbation    Examination-Activity Limitations  Reach Overhead;Lift    Examination-Participation Restrictions  Cleaning;Laundry    Stability/Clinical Decision Making  Stable/Uncomplicated    Clinical Decision Making  Low    Rehab Potential  Good    PT Frequency  2x / week    PT Duration  6 weeks    PT Treatment/Interventions  Dry needling;Passive range of motion;Patient/family education;Manual techniques;Therapeutic exercise;Therapeutic activities;Electrical Stimulation;Iontophoresis 4mg /ml  Dexamethasone;Ultrasound;Traction;Moist Heat    PT Next Visit Plan  Review HEP , Manual for pain and ROm . Add to HEP if needed. possible tracrtion, modalities    PT Home Exercise Plan  cervical retraction sit and supine , levator stretch, scapul retraction,    Consulted and Agree with Plan of Care  Patient       Patient will benefit from skilled therapeutic intervention in order to improve the following deficits and impairments:  Pain, Postural dysfunction, Increased muscle spasms  Visit Diagnosis: Radiculopathy, cervical region  Chronic left shoulder pain  Stiffness of neck  Abnormal posture     Problem List Patient Active Problem List   Diagnosis Date Noted  . Encounter for screening colonoscopy     Darrel Hoover  PT 12/20/2019, 11:49 AM  [place], [place], [address] Phone: [phone]   Fax:  [phone]  Name: Katelyn Zamora MRN: [id number] Date of Birth: 1965-12-24

## 2020-01-02 ENCOUNTER — Other Ambulatory Visit: Payer: Self-pay

## 2020-01-02 ENCOUNTER — Ambulatory Visit: Payer: Medicaid Other

## 2020-01-02 DIAGNOSIS — R293 Abnormal posture: Secondary | ICD-10-CM

## 2020-01-02 DIAGNOSIS — M5412 Radiculopathy, cervical region: Secondary | ICD-10-CM | POA: Diagnosis not present

## 2020-01-02 DIAGNOSIS — M25512 Pain in left shoulder: Secondary | ICD-10-CM

## 2020-01-02 DIAGNOSIS — G8929 Other chronic pain: Secondary | ICD-10-CM

## 2020-01-02 DIAGNOSIS — M436 Torticollis: Secondary | ICD-10-CM

## 2020-01-02 NOTE — Therapy (Signed)
[place], [place], [address] Phone: [phone]   Fax:  [phone]  Physical Therapy Treatment  Patient Details  Name: Katelyn Zamora MRN: [id number] Date of Birth: Jan 28, 1966 Referring Provider (PT): Tereasa Coop, [place]   Encounter Date: 01/02/2020  PT End of Session - 01/02/20 1140    Visit Number  2    Number of Visits  12    Date for PT Re-Evaluation  02/10/20    Authorization Type  MCD    PT Start Time  1135    PT Stop Time  1215    PT Time Calculation (min)  40 min    Activity Tolerance  Patient tolerated treatment well    Behavior During Therapy  [facility] for tasks assessed/performed       Past Medical History:  Diagnosis Date  . Complication of anesthesia   . PONV (postoperative nausea and vomiting)     Past Surgical History:  Procedure Laterality Date  . CESAREAN SECTION    . COLONOSCOPY WITH PROPOFOL N/A 01/28/2018   Procedure: COLONOSCOPY WITH PROPOFOL;  Surgeon: Lin Landsman, MD;  Location: [facility] ENDOSCOPY;  Service: Endoscopy;  Laterality: N/A;  . DILATION AND CURETTAGE OF UTERUS      There were no vitals filed for this visit.  Subjective Assessment - 01/02/20 1139    Subjective  Some better but still painful. Exercising may be helping    Currently in Pain?  No/denies                       [facility] Adult PT Treatment/Exercise - 01/02/20 0001      Exercises   Exercises  Neck      Neck Exercises: Seated   Neck Retraction  10 reps    Lateral Flexion  Right;10 reps    Lateral Flexion Limitations  and levator to RT . She needed minor cues for all exercises but was accurate after    Other Seated Exercise  yellow ban ER and hor abducvtion  x 12  band issued for HEP      Modalities   Modalities  Traction      Traction   Type of Traction  Cervical    Min (lbs)  0    Max (lbs)  12    Hold Time  60    Rest Time  15    Time  12               PT Short  Term Goals - 12/20/19 1136      PT SHORT TERM GOAL #1   Title  She will be independent with initial HEP    Baseline  no program    Time  2    Period  Weeks    Status  New      PT SHORT TERM GOAL #2   Title  She will report pain decreased 25% in neck    Baseline  6-7/10 at rest at eval    Time  2    Period  Weeks    Status  New        PT Long Term Goals - 12/20/19 1136      PT LONG TERM GOAL #1   Title  She will be indepndendent with all HEP    Baseline  Independent with all HEP issued    Time  6    Period  Weeks    Status  New  PT LONG TERM GOAL #2   Title  She will report pain intermittant  and decreased 75% in neck and LT shoulder    Baseline  6-7/10 pain at rest at eval    Time  6    Period  Weeks    Status  New      PT LONG TERM GOAL #3   Title  She will report pain decreased to 1-2/10 with home tasks uing LT arm    Baseline  Pain in neck and shoulder with home tasks using LT arm    Time  6    Period  Weeks    Status  New            Plan - 01/02/20 1141    Clinical Impression Statement  Neck and shoulder pain improved and doing exercises correctly Added band for strength and  move to rd if no problems . increase traction to 15 # if no problems.    PT Treatment/Interventions  Dry needling;Passive range of motion;Patient/family education;Manual techniques;Therapeutic exercise;Therapeutic activities;Electrical Stimulation;Iontophoresis 4mg /ml Dexamethasone;Ultrasound;Traction;Moist Heat    PT Next Visit Plan  Review HEP , Manual for pain and ROm . Add to HEP if needed. tracrtion, modalities    PT Home Exercise Plan  cervical retraction sit and supine , levator stretch, scapul retraction,  Shoulder ER with band  Hor abduct with band    Consulted and Agree with Plan of Care  Patient       Patient will benefit from skilled therapeutic intervention in order to improve the following deficits and impairments:  Pain, Postural dysfunction, Increased muscle  spasms  Visit Diagnosis: Radiculopathy, cervical region  Chronic left shoulder pain  Stiffness of neck  Abnormal posture     Problem List Patient Active Problem List   Diagnosis Date Noted  . Encounter for screening colonoscopy     Darrel Hoover  PT 01/02/2020, 12:07 PM  [place], [place], [address] Phone: [phone]   Fax:  [phone]  Name: Katelyn Zamora MRN: [id number] Date of Birth: [birth year]

## 2020-01-09 ENCOUNTER — Other Ambulatory Visit: Payer: Self-pay

## 2020-01-09 ENCOUNTER — Ambulatory Visit: Payer: Medicaid Other | Attending: Physician Assistant

## 2020-01-09 DIAGNOSIS — M436 Torticollis: Secondary | ICD-10-CM | POA: Insufficient documentation

## 2020-01-09 DIAGNOSIS — R293 Abnormal posture: Secondary | ICD-10-CM | POA: Diagnosis present

## 2020-01-09 DIAGNOSIS — G8929 Other chronic pain: Secondary | ICD-10-CM | POA: Insufficient documentation

## 2020-01-09 DIAGNOSIS — M5412 Radiculopathy, cervical region: Secondary | ICD-10-CM | POA: Diagnosis present

## 2020-01-09 DIAGNOSIS — M25512 Pain in left shoulder: Secondary | ICD-10-CM | POA: Insufficient documentation

## 2020-01-09 NOTE — Patient Instructions (Signed)
Band rows/ ER/ extension x 10-20 reps daILY 1-2 SEC HOLD   BiLATERALLY

## 2020-01-09 NOTE — Therapy (Signed)
[place], [place], [address] Phone: [phone]   Fax:  [phone]  Physical Therapy Treatment  Patient Details  Name: Katelyn Zamora MRN: [id number] Date of Birth: 02/27/1966 Referring Provider (PT): Tereasa Coop, [place]   Encounter Date: 01/09/2020  PT End of Session - 01/09/20 1401    Visit Number  3    Number of Visits  12    Date for PT Re-Evaluation  02/10/20    Authorization Type  MCD    Authorization Time Period  to 01/11/20    Authorization - Visit Number  2    Authorization - Number of Visits  3    PT Start Time  0201    PT Stop Time  0245    PT Time Calculation (min)  44 min    Activity Tolerance  Patient tolerated treatment well    Behavior During Therapy  [facility] for tasks assessed/performed       Past Medical History:  Diagnosis Date  . Complication of anesthesia   . PONV (postoperative nausea and vomiting)     Past Surgical History:  Procedure Laterality Date  . CESAREAN SECTION    . COLONOSCOPY WITH PROPOFOL N/A 01/28/2018   Procedure: COLONOSCOPY WITH PROPOFOL;  Surgeon: Lin Landsman, MD;  Location: [facility] ENDOSCOPY;  Service: Endoscopy;  Laterality: N/A;  . DILATION AND CURETTAGE OF UTERUS      There were no vitals filed for this visit.  Subjective Assessment - 01/09/20 1404    Subjective  Her neck  still has kinks. Don't feel in neck as much a Lt shoulder    Currently in Pain?  No/denies   at restr                      [facility] Adult PT Treatment/Exercise - 01/09/20 0001      Neck Exercises: Machines for Strengthening   UBE (Upper Arm Bike)  L2 5 min      Neck Exercises: Standing   Other Standing Exercises  row / extendsion /ER red band x 15 reps.       Modalities   Modalities  Moist Heat      Moist Heat Therapy   Number Minutes Moist Heat  10 Minutes    Moist Heat Location  Shoulder      Manual Therapy   Manual Therapy  Joint mobilization;Soft  tissue mobilization;Taping    Soft tissue mobilization  neck and LT shoulder    Kinesiotex  Ligament Correction      Kinesiotix   Ligament Correction  LT AC joint 1 strap over LT shoulder medial to [facility] joint             PT Education - 01/09/20 1451    Education Details  HEP , tape management remove if irritating skin or leve on for 3-4 days and can shower with tape on.    Person(s) Educated  Patient    Methods  Explanation;Demonstration;Verbal cues;Handout    Comprehension  Returned demonstration;Verbalized understanding       PT Short Term Goals - 01/09/20 1407      PT SHORT TERM GOAL #1   Title  She will be independent with initial HEP    Status  Achieved      PT SHORT TERM GOAL #2   Title  She will report pain decreased 25% in neck    Baseline  none at restr , most pain in LT shoulder  Status  Achieved        PT Long Term Goals - 12/20/19 1136      PT LONG TERM GOAL #1   Title  She will be indepndendent with all HEP    Baseline  Independent with all HEP issued    Time  6    Period  Weeks    Status  New      PT LONG TERM GOAL #2   Title  She will report pain intermittant  and decreased 75% in neck and LT shoulder    Baseline  6-7/10 pain at rest at eval    Time  6    Period  Weeks    Status  New      PT LONG TERM GOAL #3   Title  She will report pain decreased to 1-2/10 with home tasks uing LT arm    Baseline  Pain in neck and shoulder with home tasks using LT arm    Time  6    Period  Weeks    Status  New            Plan - 01/09/20 1406    Clinical Impression Statement  Appears primary problem is LT shoulder. Taped for possible AC joint irriation and pain. Soft tissue in axilla tneder so addressed this.  will focus on shoulder unless  neck pain starts . Both STG met.    PT Frequency  2x / week    PT Duration  4 weeks    PT Treatment/Interventions  Dry needling;Passive range of motion;Patient/family education;Manual techniques;Therapeutic  exercise;Therapeutic activities;Electrical Stimulation;Iontophoresis 47m/ml Dexamethasone;Ultrasound;Traction;Moist Heat    PT Next Visit Plan  Review HEP , Manual for pain and ROm . Add to HEP if needed. tracrtion if needed, modalities, STW    PT Home Exercise Plan  cervical retraction sit and supine , levator stretch, scapul retraction,  Shoulder ER with band  Hor abduct with band    Consulted and Agree with Plan of Care  Patient       Patient will benefit from skilled therapeutic intervention in order to improve the following deficits and impairments:  Pain, Postural dysfunction, Increased muscle spasms  Visit Diagnosis: Radiculopathy, cervical region  Chronic left shoulder pain  Stiffness of neck  Abnormal posture     Problem List Patient Active Problem List   Diagnosis Date Noted  . Encounter for screening colonoscopy     CDarrel HooverPT 01/09/2020, 2:55 PM  [place] [address]Phone: [phone]  Fax:  [phone] Name: TAshli SeldersMRN: [id number] of Birth: 103/08/1966

## 2020-01-16 ENCOUNTER — Other Ambulatory Visit: Payer: Self-pay

## 2020-01-16 ENCOUNTER — Encounter: Payer: Self-pay | Admitting: Physical Therapy

## 2020-01-16 ENCOUNTER — Ambulatory Visit: Payer: Medicaid Other | Admitting: Physical Therapy

## 2020-01-16 DIAGNOSIS — M436 Torticollis: Secondary | ICD-10-CM

## 2020-01-16 DIAGNOSIS — R293 Abnormal posture: Secondary | ICD-10-CM

## 2020-01-16 DIAGNOSIS — G8929 Other chronic pain: Secondary | ICD-10-CM

## 2020-01-16 DIAGNOSIS — M25512 Pain in left shoulder: Secondary | ICD-10-CM

## 2020-01-16 DIAGNOSIS — M5412 Radiculopathy, cervical region: Secondary | ICD-10-CM | POA: Diagnosis not present

## 2020-01-16 NOTE — Therapy (Signed)
[place], [place], [address] Phone: [phone]   Fax:  [phone]  Physical Therapy Treatment  Patient Details  Name: Katelyn Zamora MRN: [id number] Date of Birth: 31-Jan-1966 Referring Provider (PT): Tereasa Coop, [place]   Encounter Date: 01/16/2020  PT End of Session - 01/16/20 0855    Visit Number  4    Number of Visits  12    Date for PT Re-Evaluation  02/10/20    Authorization Type  MCD    Authorization Time Period  5/10-6/6    Authorization - Visit Number  1    Authorization - Number of Visits  8    PT Start Time  [id number]    PT Stop Time  0925    PT Time Calculation (min)  38 min    Activity Tolerance  Patient tolerated treatment well    Behavior During Therapy  [facility] for tasks assessed/performed       Past Medical History:  Diagnosis Date  . Complication of anesthesia   . PONV (postoperative nausea and vomiting)     Past Surgical History:  Procedure Laterality Date  . CESAREAN SECTION    . COLONOSCOPY WITH PROPOFOL N/A 01/28/2018   Procedure: COLONOSCOPY WITH PROPOFOL;  Surgeon: Lin Landsman, MD;  Location: [facility] ENDOSCOPY;  Service: Endoscopy;  Laterality: N/A;  . DILATION AND CURETTAGE OF UTERUS      There were no vitals filed for this visit.  Subjective Assessment - 01/16/20 0849    Subjective  Feels some improvement but still noting pain in left lateral and posterior shoulder 3/10 this AM. Pt. is scheduled for lumpectomy surgery this Thursday 01/19/20-discussed would hold PT after pending MD clearance/restrictions regarding resuming PT and continue as cleared after surgery.    Currently in Pain?  Yes    Pain Score  3     Pain Location  Shoulder    Pain Orientation  Left    Pain Descriptors / Indicators  Sharp    Pain Type  Chronic pain    Pain Onset  More than a month ago    Pain Frequency  Intermittent    Aggravating Factors   left arm use    Pain Relieving Factors  tylenol                        [facility] Adult PT Treatment/Exercise - 01/16/20 0001      Neck Exercises: Machines for Strengthening   UBE (Upper Arm Bike)  L2 5 min      Neck Exercises: Theraband   Shoulder Extension  20 reps;Green    Rows  20 reps;Green    Rows Limitations  started with red x 20 but switched to green and performed x 20 additional reps    Horizontal ABduction  20 reps;Red    Horizontal ABduction Limitations  supine      Neck Exercises: Supine   Neck Retraction  15 reps      Neck Exercises: Prone   Other Prone Exercise  sidelying ER left shoulder 1 lb. 2x10      Manual Therapy   Manual Therapy  Manual Traction    Soft tissue mobilization  left posterior shoulder    Manual Traction  cervical      Neck Exercises: Stretches   Upper Trapezius Stretch  Left;2 reps;30 seconds    Levator Stretch  Left;2 reps;30 seconds  PT Education - 01/16/20 0857    Education Details  POC    Person(s) Educated  Patient    Methods  Explanation    Comprehension  Verbalized understanding       PT Short Term Goals - 01/09/20 1407      PT SHORT TERM GOAL #1   Title  She will be independent with initial HEP    Status  Achieved      PT SHORT TERM GOAL #2   Title  She will report pain decreased 25% in neck    Baseline  none at restr , most pain in LT shoulder    Status  Achieved        PT Long Term Goals - 12/20/19 1136      PT LONG TERM GOAL #1   Title  She will be indepndendent with all HEP    Baseline  Independent with all HEP issued    Time  6    Period  Weeks    Status  New      PT LONG TERM GOAL #2   Title  She will report pain intermittant  and decreased 75% in neck and LT shoulder    Baseline  6-7/10 pain at rest at eval    Time  6    Period  Weeks    Status  New      PT LONG TERM GOAL #3   Title  She will report pain decreased to 1-2/10 with home tasks uing LT arm    Baseline  Pain in neck and shoulder with home tasks using LT arm     Time  6    Period  Weeks    Status  New            Plan - 01/16/20 0857    Clinical Impression Statement  Mild improvement to date with therapy still with some issues for local left shoulder pain>neck pain. As noted in subjective pt. is pending lumpectomy surgery 01/19/20 so plan hold therapy pending MD clearance to resume/any restrictions s/p surgery and resume when cleared.    Personal Factors and Comorbidities  Fitness;Time since onset of injury/illness/exacerbation    Examination-Activity Limitations  Reach Overhead;Lift    Examination-Participation Restrictions  Cleaning;Laundry    Stability/Clinical Decision Making  Stable/Uncomplicated    Clinical Decision Making  Low    Rehab Potential  Good    PT Frequency  2x / week    PT Duration  4 weeks    PT Treatment/Interventions  Dry needling;Passive range of motion;Patient/family education;Manual techniques;Therapeutic exercise;Therapeutic activities;Electrical Stimulation;Iontophoresis 4mg /ml Dexamethasone;Ultrasound;Traction;Moist Heat    PT Next Visit Plan  await clearance from MD to resume therapy s/p lumpectomy then continue POC as tolerated    PT Home Exercise Plan  cervical retraction sit and supine , levator stretch, scapul retraction,  Shoulder ER with band  Hor abduct with band    Consulted and Agree with Plan of Care  Patient       Patient will benefit from skilled therapeutic intervention in order to improve the following deficits and impairments:  Pain, Postural dysfunction, Increased muscle spasms  Visit Diagnosis: Radiculopathy, cervical region  Chronic left shoulder pain  Stiffness of neck  Abnormal posture     Problem List Patient Active Problem List   Diagnosis Date Noted  . Encounter for screening colonoscopy     Beaulah Dinning, PT, DPT 01/16/20 4:43 PM  [place],  [place], [address] Phone: [phone]   Fax:   [phone]  Name: Katelyn Zamora MRN: [id number] Date of Birth: 1965-12-11

## 2020-02-22 NOTE — Therapy (Signed)
[place], [place], [address] Phone: [phone]   Fax:  [phone]  Physical Therapy Treatment/Discharge  Patient Details  Name: Katelyn Zamora MRN: [id number] Date of Birth: 11/25/1965 Referring Provider (PT): Tereasa Coop, [place]   Encounter Date: 01/16/2020    Past Medical History:  Diagnosis Date  . Complication of anesthesia   . PONV (postoperative nausea and vomiting)     Past Surgical History:  Procedure Laterality Date  . CESAREAN SECTION    . COLONOSCOPY WITH PROPOFOL N/A 01/28/2018   Procedure: COLONOSCOPY WITH PROPOFOL;  Surgeon: Lin Landsman, MD;  Location: [facility] ENDOSCOPY;  Service: Endoscopy;  Laterality: N/A;  . DILATION AND CURETTAGE OF UTERUS      There were no vitals filed for this visit.                                PT Short Term Goals - 01/09/20 1407      PT SHORT TERM GOAL #1   Title She will be independent with initial HEP    Status Achieved      PT SHORT TERM GOAL #2   Title She will report pain decreased 25% in neck    Baseline none at restr , most pain in LT shoulder    Status Achieved             PT Long Term Goals - 12/20/19 1136      PT LONG TERM GOAL #1   Title She will be indepndendent with all HEP    Baseline Independent with all HEP issued    Time 6    Period Weeks    Status New      PT LONG TERM GOAL #2   Title She will report pain intermittant  and decreased 75% in neck and LT shoulder    Baseline 6-7/10 pain at rest at eval    Time 6    Period Weeks    Status New      PT LONG TERM GOAL #3   Title She will report pain decreased to 1-2/10 with home tasks uing LT arm    Baseline Pain in neck and shoulder with home tasks using LT arm    Time 6    Period Weeks    Status New                  Patient will benefit from skilled therapeutic intervention in order to improve the following deficits and  impairments:  Pain, Postural dysfunction, Increased muscle spasms  Visit Diagnosis: Radiculopathy, cervical region  Chronic left shoulder pain  Stiffness of neck  Abnormal posture     Problem List Patient Active Problem List   Diagnosis Date Noted  . Encounter for screening colonoscopy         PHYSICAL THERAPY DISCHARGE SUMMARY  Visits from Start of Care: 4  Current functional level related to goals / functional outcomes: Patient did not return for further therapy after last session 01/16/20-she was pending lumpectomy surgery so further PT put on hold/has not returned.   Remaining deficits: Current status unknown   Education / Equipment: NA Plan: Patient agrees to discharge.  Patient goals were not met. Patient is being discharged due to not returning since the last visit.  ?????           Beaulah Dinning, PT, DPT 02/22/20 4:26 PM  [place], [place], [address] Phone: [phone]   Fax:  [phone]  Name: Katelyn Zamora MRN: [id number] Date of Birth: 1965/11/04

## 2021-01-25 ENCOUNTER — Telehealth: Payer: Self-pay | Admitting: *Deleted

## 2021-01-25 NOTE — Telephone Encounter (Signed)
Notes on file from [facility], Tereasa Coop, [place], [phone]. Referral sent to scheduling.

## 2021-02-27 ENCOUNTER — Other Ambulatory Visit: Payer: Self-pay

## 2021-02-27 ENCOUNTER — Encounter: Payer: Self-pay | Admitting: Pulmonary Disease

## 2021-02-27 ENCOUNTER — Ambulatory Visit (INDEPENDENT_AMBULATORY_CARE_PROVIDER_SITE_OTHER): Payer: Medicaid Other | Admitting: Pulmonary Disease

## 2021-02-27 VITALS — BP 118/62 | HR 72 | Ht 65.0 in | Wt 270.2 lb

## 2021-02-27 DIAGNOSIS — R0602 Shortness of breath: Secondary | ICD-10-CM

## 2021-02-27 NOTE — Progress Notes (Signed)
Katelyn Zamora    [id number]    16-Sep-1965  Primary Care Physician:Payne, Lu Duffel, PA-C  Referring Physician: Elgie Collard, [place],  [place] [address]  Chief complaint:   Patient being evaluated for shortness of breath  HPI:  Had [place] in January, took about 3 weeks to recover Did not have any significant difficulty after a while but soon noticed more shortness of breath with activity  Humidity, exposure to pollen usually exacerbate symptoms  She does have a prescription for albuterol which she has not been using much of  History of obstructive sleep apnea Has been using CPAP since about September -Could not get a download from today  Did not have any underlying lung disease Never smoker No pertinent occupational history-no predisposition to lung disease  She usually tries to stay very active, walks over 10,000 miles  She is in the bariatric surgery program-considering surgery just needs a PFT  Outpatient Encounter Medications as of 02/27/2021  Medication Sig   aspirin EC 81 MG tablet Take 81 mg by mouth daily. Swallow whole.   letrozole (FEMARA) 2.5 MG tablet Take by mouth.   losartan (COZAAR) 25 MG tablet Take by mouth.   Multiple Vitamin (MULTI-VITAMINS PO) Take by mouth daily.   OVER THE COUNTER MEDICATION Sleep aid   PROAIR HFA 108 (90 Base) MCG/ACT inhaler Inhale 2 puffs into the lungs every 6 (six) hours as needed.   ibuprofen (ADVIL) 600 MG tablet Take 1 tablet (600 mg total) by mouth every 6 (six) hours as needed. (Patient not taking: No sig reported)   No facility-administered encounter medications on file as of 02/27/2021.    Allergies as of 02/27/2021 - Review Complete 02/27/2021  Allergen Reaction Noted   Penicillins  01/28/2018    Past Medical History:  Diagnosis Date   Complication of anesthesia    PONV (postoperative nausea and vomiting)     Past Surgical History:  Procedure Laterality Date    CESAREAN SECTION     COLONOSCOPY WITH PROPOFOL N/A 01/28/2018   Procedure: COLONOSCOPY WITH PROPOFOL;  Surgeon: Lin Landsman, MD;  Location: [facility] ENDOSCOPY;  Service: Endoscopy;  Laterality: N/A;   DILATION AND CURETTAGE OF UTERUS      No family history on file.  Social History   Socioeconomic History   Marital status: Married    Spouse name: Not on file   Number of children: Not on file   Years of education: Not on file   Highest education level: Not on file  Occupational History   Not on file  Tobacco Use   Smoking status: Former    Packs/day: 1.00    Pack years: 0.00    Types: Cigarettes    Start date: 75    Quit date: 2006    Years since quitting: 16.4   Smokeless tobacco: Never  Vaping Use   Vaping Use: Never used  Substance and Sexual Activity   Alcohol use: Never   Drug use: Never   Sexual activity: Not on file  Other Topics Concern   Not on file  Social History Narrative   Not on file   Social Determinants of Health   Financial Resource Strain: Not on file  Food Insecurity: Not on file  Transportation Needs: Not on file  Physical Activity: Not on file  Stress: Not on file  Social Connections: Not on file  Intimate Partner Violence: Not on file  Review of Systems  Respiratory:  Positive for shortness of breath.    Vitals:   02/27/21 1126  BP: 118/62  Pulse: 72  SpO2: 94%     Physical Exam Constitutional:      Appearance: She is obese.  HENT:     Head: Normocephalic.     Mouth/Throat:     Mouth: Mucous membranes are moist.  Eyes:     General:        Right eye: No discharge.        Left eye: No discharge.     Conjunctiva/sclera: Conjunctivae normal.     Pupils: Pupils are equal, round, and reactive to light.  Cardiovascular:     Rate and Rhythm: Normal rate.     Heart sounds: No murmur heard.   No friction rub.  Pulmonary:     Effort: No respiratory distress.     Breath sounds: No stridor. No wheezing or rhonchi.   Musculoskeletal:     Cervical back: No rigidity or tenderness.  Neurological:     Mental Status: She is alert.  Psychiatric:        Mood and Affect: Mood normal.    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  Importance of graded exercise as well as discussed with the patient  Plan/Recommendations: Will obtain a pulmonary function test  Encourage graded exercise as tolerated  Encouraged to continue CPAP on a regular basis  Tentative follow-up in about 3 to 4 weeks  Encouraged to call with any significant concerns  Encouraged to use albuterol as needed up to 4 times a day, proper inhaler technique was reviewed with the patient     Sherrilyn Rist MD [facility] 02/27/2021, 12:03 PM  CC: Elgie Collard, MD

## 2021-02-27 NOTE — Patient Instructions (Signed)
Continue using albuterol as needed, you may need to use it up to 4 times a day if you are having issues with your breathing  Schedule for breathing study  I will see you in about 3 to 4 weeks  Call with significant concerns

## 2021-03-17 IMAGING — US ULTRASOUND ABDOMEN LIMITED
1 series · 14 of 25 positions shown · non-contrast
Comparison: None.

CLINICAL DATA: Right upper quadrant pain

EXAM:
ULTRASOUND ABDOMEN LIMITED RIGHT UPPER QUADRANT

[Series 1: ultrasound abdomen limited · 14 of 38 slices shown]
[im 1/38]
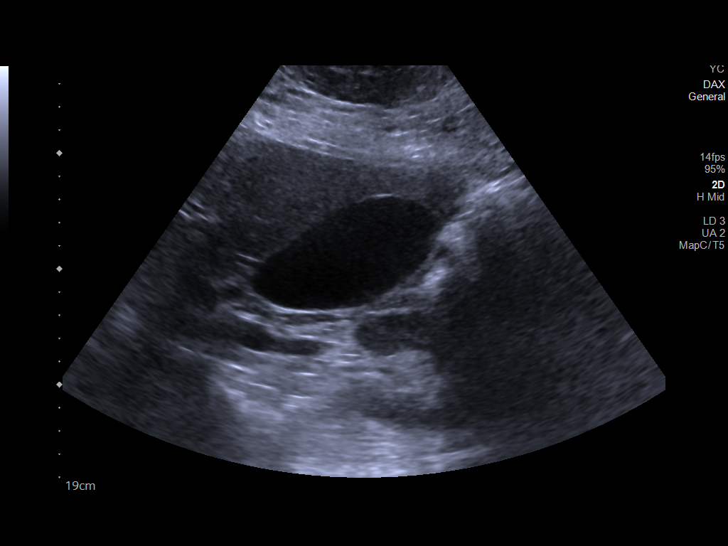
[im 4/38]
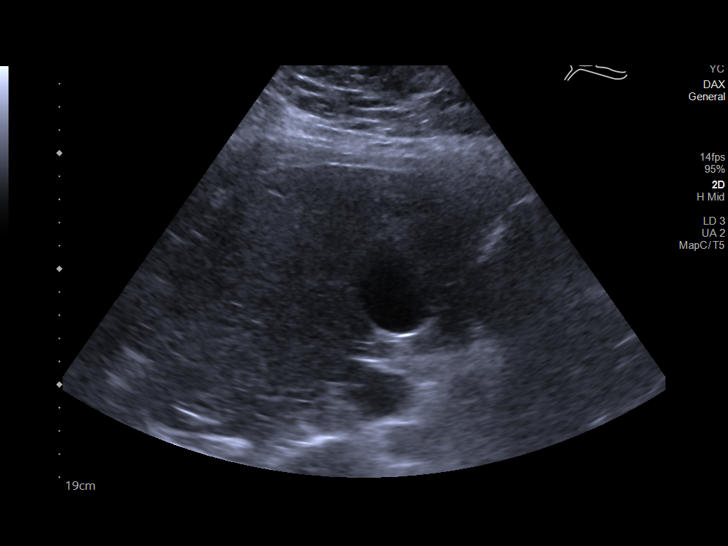
[im 7/38]
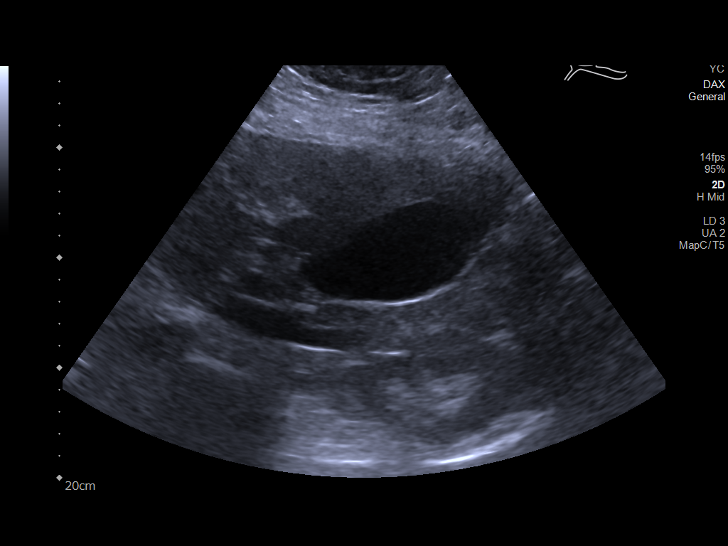
[im 10/38]
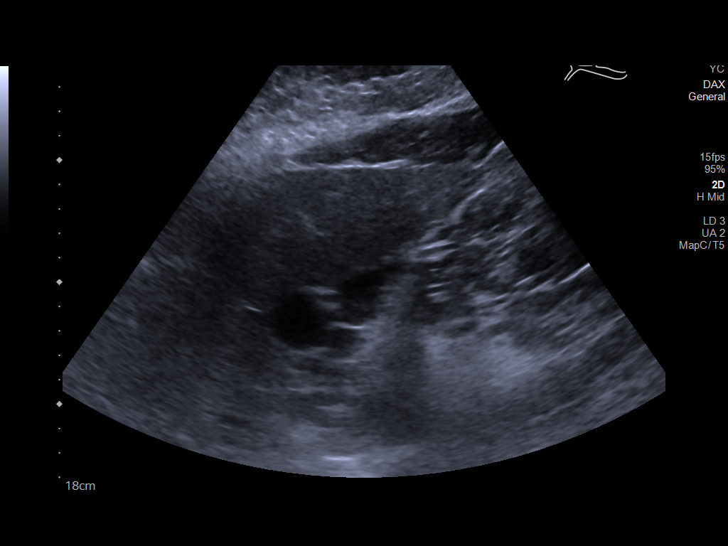
[im 13/38]
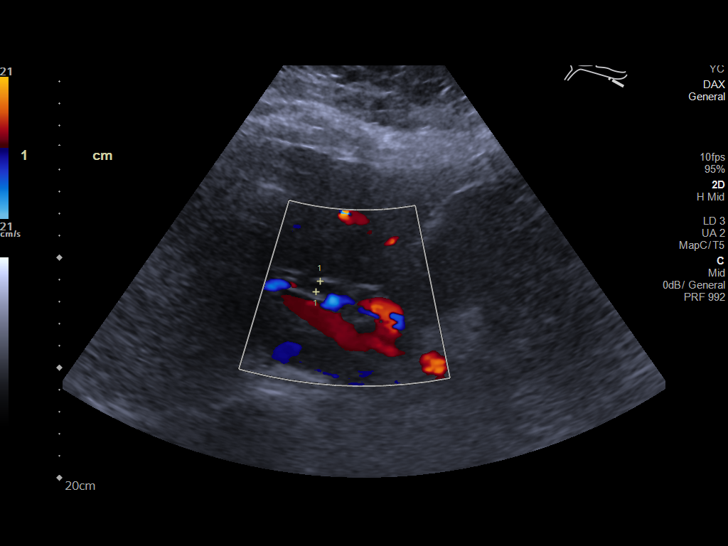
[im 14/38]
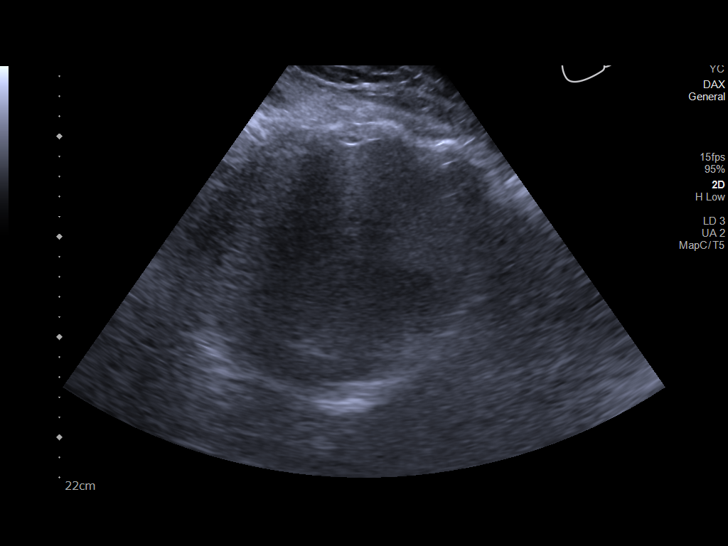
[im 17/38]
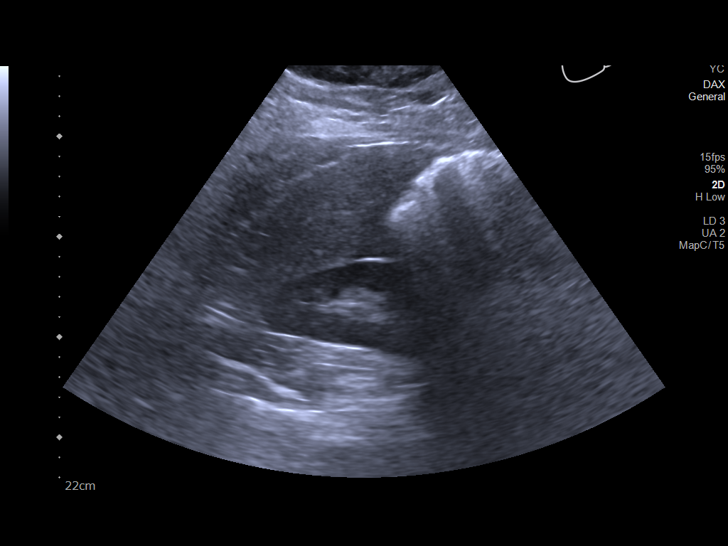
[im 21/38]
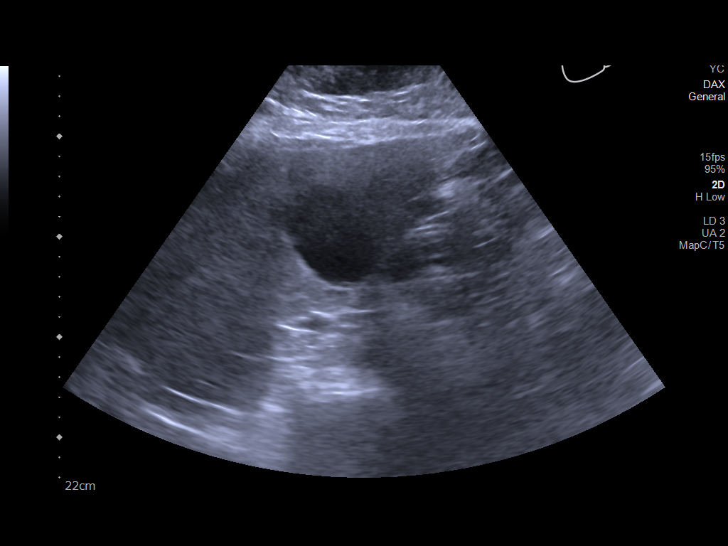
[im 24/38]
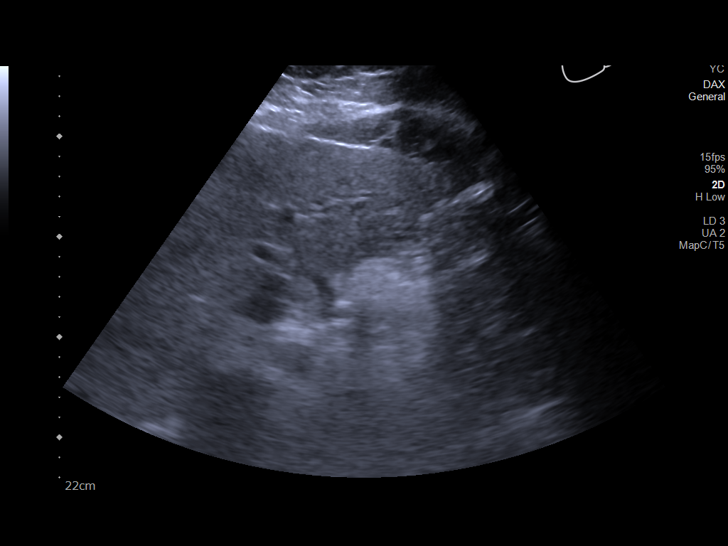
[im 25/38]
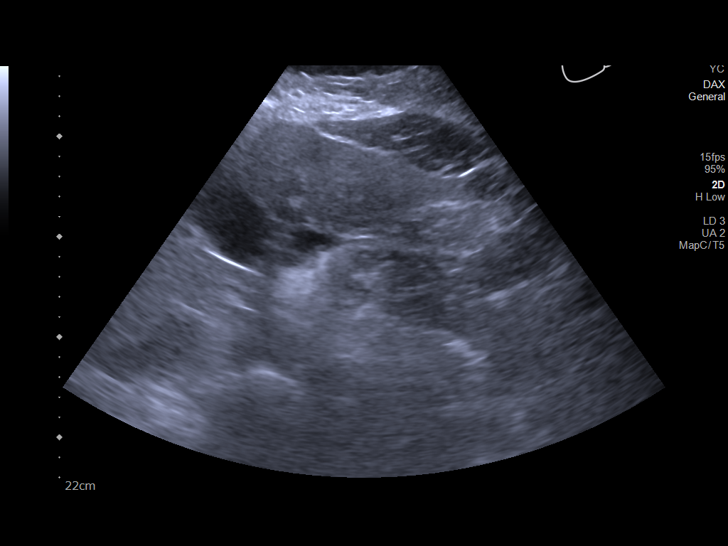
[im 28/38]
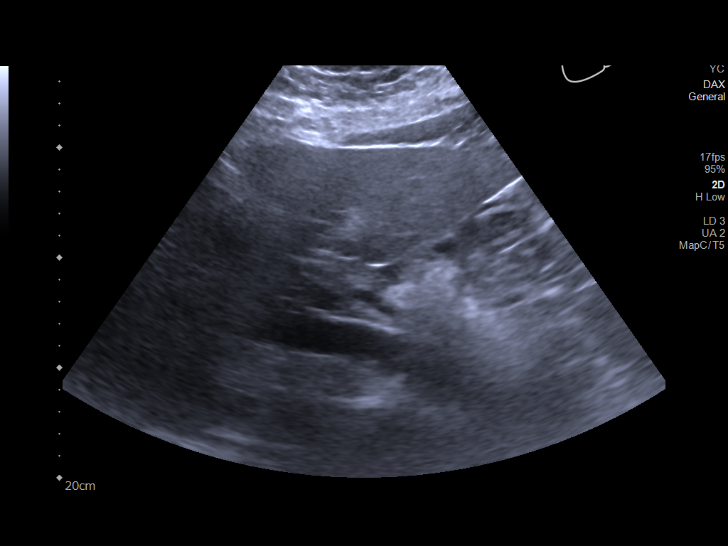
[im 31/38]
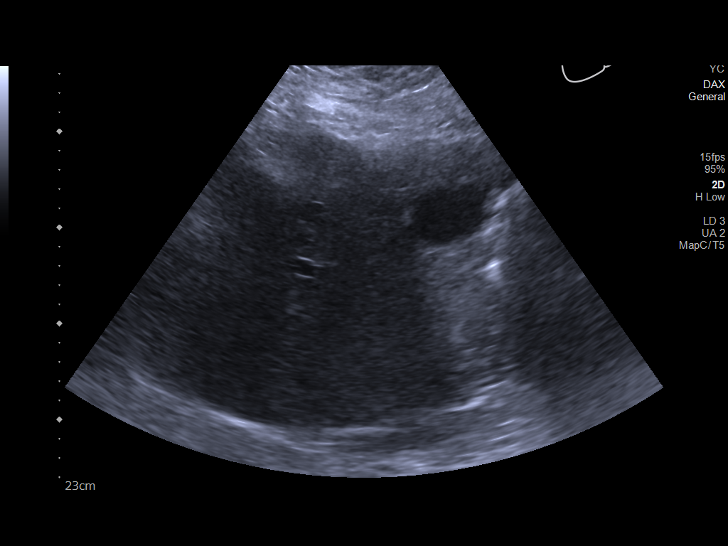
[im 34/38]
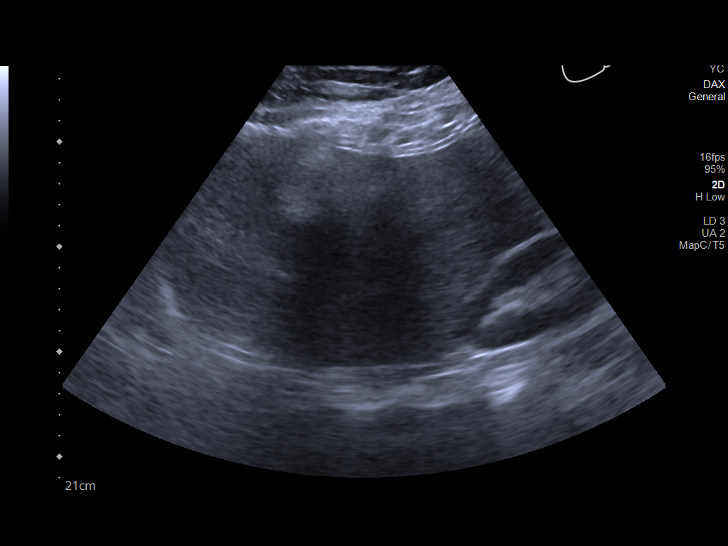
[im 38/38]
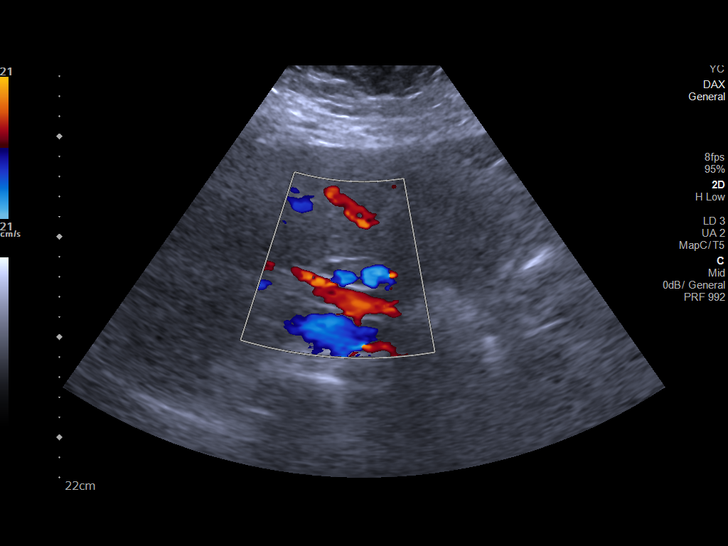

[14 of 25 positions shown; findings below may reference images not displayed]

FINDINGS: Gallbladder:

No gallstones or wall thickening visualized. No sonographic Murphy
sign noted by sonographer.

Common bile duct:

Diameter: 5 mm

Liver:

No focal lesion identified. Echogenic liver when compared to the
kidney. Portal vein is patent on color Doppler imaging with normal
direction of blood flow towards the liver.
IMPRESSION: No acute finding or cholelithiasis.

Hepatic steatosis.

## 2021-04-12 ENCOUNTER — Other Ambulatory Visit: Payer: Self-pay

## 2021-04-12 ENCOUNTER — Ambulatory Visit (INDEPENDENT_AMBULATORY_CARE_PROVIDER_SITE_OTHER): Payer: Medicaid Other | Admitting: Pulmonary Disease

## 2021-04-12 ENCOUNTER — Encounter: Payer: Self-pay | Admitting: Pulmonary Disease

## 2021-04-12 VITALS — BP 118/78 | HR 83 | Temp 98.0°F | Ht 65.5 in | Wt 268.6 lb

## 2021-04-12 DIAGNOSIS — R0602 Shortness of breath: Secondary | ICD-10-CM

## 2021-04-12 LAB — PULMONARY FUNCTION TEST
DL/VA % pred: 141 %
DL/VA: 5.98 ml/min/mmHg/L
DLCO cor % pred: 121 %
DLCO cor: 26.55 ml/min/mmHg
DLCO unc % pred: 121 %
DLCO unc: 26.55 ml/min/mmHg
FEF 25-75 Post: 3 L/s
FEF 25-75 Pre: 3.44 L/s
FEF2575-%Change-Post: -12 %
FEF2575-%Pred-Post: 111 %
FEF2575-%Pred-Pre: 127 %
FEV1-%Change-Post: -1 %
FEV1-%Pred-Post: 82 %
FEV1-%Pred-Pre: 83 %
FEV1-Post: 2.35 L
FEV1-Pre: 2.39 L
FEV1FVC-%Change-Post: 0 %
FEV1FVC-%Pred-Pre: 110 %
FEV6-%Change-Post: 0 %
FEV6-%Pred-Post: 75 %
FEV6-%Pred-Pre: 75 %
FEV6-Post: 2.68 L
FEV6-Pre: 2.65 L
FEV6FVC-%Change-Post: 0 %
FEV6FVC-%Pred-Post: 103 %
FEV6FVC-%Pred-Pre: 102 %
FVC-%Change-Post: -1 %
FVC-%Pred-Post: 73 %
FVC-%Pred-Pre: 74 %
FVC-Post: 2.68 L
FVC-Pre: 2.72 L
Post FEV1/FVC ratio: 88 %
Post FEV6/FVC ratio: 100 %
Pre FEV1/FVC ratio: 88 %
Pre FEV6/FVC Ratio: 99 %
RV % pred: 83 %
RV: 1.62 L
TLC % pred: 91 %
TLC: 4.81 L

## 2021-04-12 NOTE — Patient Instructions (Signed)
Continue albuterol as needed  Prescription for a spacer device  Call with significant concerns  I will see you in a year from now  Continue your CPAP on a regular basis

## 2021-04-12 NOTE — Progress Notes (Signed)
Katelyn Zamora    [id number]    25-Jul-1966  Primary Care Physician:Payne, Lu Duffel, PA-C  Referring Physician: Sue Lush, PA-C [place],  [place] [address]  Chief complaint:   Patient being evaluated for shortness of breath Breathing is better  HPI:  Had [place] in January, took about 3 weeks to recover Did not have any significant difficulty after a while but soon noticed more shortness of breath with activity  Breathing is better Occasional use of albuterol  Humidity, exposure to pollen exacerbate symptoms  History of obstructive sleep apnea Has been using CPAP since about September -Could not get a download from today -Has no difficulty continuing with CPAP on a regular basis  Did not have any underlying lung disease Never smoker No pertinent occupational history-no predisposition to lung disease  She usually tries to stay very active, walks over 10,000 miles  She is in the bariatric surgery program-considering surgery just needs a PFT  Outpatient Encounter Medications as of 04/12/2021  Medication Sig   aspirin EC 81 MG tablet Take 81 mg by mouth daily. Swallow whole.   Cholecalciferol 25 MCG (1000 UT) tablet Take by mouth.   ibuprofen (ADVIL) 600 MG tablet Take 1 tablet (600 mg total) by mouth every 6 (six) hours as needed.   letrozole (FEMARA) 2.5 MG tablet Take by mouth.   losartan (COZAAR) 25 MG tablet Take by mouth.   Multiple Vitamin (MULTI-VITAMINS PO) Take by mouth daily.   OVER THE COUNTER MEDICATION Sleep aid   PROAIR HFA 108 (90 Base) MCG/ACT inhaler Inhale 2 puffs into the lungs every 6 (six) hours as needed.   No facility-administered encounter medications on file as of 04/12/2021.    Allergies as of 04/12/2021 - Review Complete 04/12/2021  Allergen Reaction Noted   Penicillins  01/28/2018    Past Medical History:  Diagnosis Date   Complication of anesthesia    PONV (postoperative nausea and vomiting)      Past Surgical History:  Procedure Laterality Date   CESAREAN SECTION     COLONOSCOPY WITH PROPOFOL N/A 01/28/2018   Procedure: COLONOSCOPY WITH PROPOFOL;  Surgeon: Lin Landsman, MD;  Location: [facility] ENDOSCOPY;  Service: Endoscopy;  Laterality: N/A;   DILATION AND CURETTAGE OF UTERUS      History reviewed. No pertinent family history.  Social History   Socioeconomic History   Marital status: Married    Spouse name: Not on file   Number of children: Not on file   Years of education: Not on file   Highest education level: Not on file  Occupational History   Not on file  Tobacco Use   Smoking status: Former    Packs/day: 1.00    Types: Cigarettes    Start date: 14    Quit date: 2006    Years since quitting: 16.6   Smokeless tobacco: Never  Vaping Use   Vaping Use: Never used  Substance and Sexual Activity   Alcohol use: Never   Drug use: Never   Sexual activity: Not on file  Other Topics Concern   Not on file  Social History Narrative   Not on file   Social Determinants of Health   Financial Resource Strain: Not on file  Food Insecurity: Not on file  Transportation Needs: Not on file  Physical Activity: Not on file  Stress: Not on file  Social Connections: Not on file  Intimate Partner Violence: Not  on file    Review of Systems  Respiratory:  Positive for shortness of breath.    Vitals:   04/12/21 1111  BP: 118/78  Pulse: 83  Temp: 98 F (36.7 C)  SpO2: 98%     Physical Exam Constitutional:      Appearance: She is obese.  HENT:     Head: Normocephalic.     Mouth/Throat:     Mouth: Mucous membranes are moist.  Eyes:     General:        Right eye: No discharge.        Left eye: No discharge.     Conjunctiva/sclera: Conjunctivae normal.     Pupils: Pupils are equal, round, and reactive to light.  Cardiovascular:     Rate and Rhythm: Normal rate.     Heart sounds: No murmur heard.   No friction rub.  Pulmonary:     Effort: No  respiratory distress.     Breath sounds: No stridor. No wheezing or rhonchi.  Musculoskeletal:     Cervical back: No rigidity or tenderness.  Neurological:     Mental Status: She is alert.  Psychiatric:        Mood and Affect: Mood normal.    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  Importance of graded exercise as well as discussed with the patient  Plan/Recommendations: Clear for surgery  Continue CPAP on a regular basis  Prescription for spacer device  I will see her in a year  Encouraged to call with any significant concerns.   Sherrilyn Rist MD [facility] 04/12/2021, 11:16 AM  CC: Sue Lush, PA-C

## 2021-04-12 NOTE — Progress Notes (Signed)
Full PFT completed today ? ?

## 2021-04-15 ENCOUNTER — Telehealth: Payer: Self-pay | Admitting: Pulmonary Disease

## 2021-04-15 DIAGNOSIS — R0602 Shortness of breath: Secondary | ICD-10-CM

## 2021-04-15 NOTE — Telephone Encounter (Signed)
Call returned to patient, confirmed DOB. Patient is requesting order be sent to [facility].   New order placed.   Nothing further needed at this time.
